# Patient Record
Sex: Female | Born: 1970 | Race: White | Hispanic: No | Marital: Single | Smoking: Current every day smoker
Health system: Southern US, Community
[De-identification: ages and names within clinical notes are randomized; demographics above are authoritative.]

## PROBLEM LIST (undated history)

## (undated) DIAGNOSIS — N920 Excessive and frequent menstruation with regular cycle: Secondary | ICD-10-CM

## (undated) HISTORY — DX: Excessive and frequent menstruation with regular cycle: N92.0

---

## 1998-04-14 ENCOUNTER — Emergency Department (HOSPITAL_COMMUNITY): Admission: EM | Admit: 1998-04-14 | Discharge: 1998-04-14 | Payer: Self-pay | Admitting: Emergency Medicine

## 2000-03-21 ENCOUNTER — Emergency Department (HOSPITAL_COMMUNITY): Admission: EM | Admit: 2000-03-21 | Discharge: 2000-03-21 | Payer: Self-pay | Admitting: Emergency Medicine

## 2001-09-20 ENCOUNTER — Ambulatory Visit (HOSPITAL_COMMUNITY): Admission: RE | Admit: 2001-09-20 | Discharge: 2001-09-20 | Payer: Self-pay | Admitting: *Deleted

## 2001-09-20 ENCOUNTER — Encounter: Payer: Self-pay | Admitting: *Deleted

## 2003-01-09 ENCOUNTER — Encounter: Payer: Self-pay | Admitting: Emergency Medicine

## 2003-01-09 ENCOUNTER — Emergency Department (HOSPITAL_COMMUNITY): Admission: EM | Admit: 2003-01-09 | Discharge: 2003-01-09 | Payer: Self-pay | Admitting: Emergency Medicine

## 2004-05-05 ENCOUNTER — Ambulatory Visit (HOSPITAL_COMMUNITY): Admission: RE | Admit: 2004-05-05 | Discharge: 2004-05-05 | Payer: Self-pay | Admitting: Family Medicine

## 2004-05-18 ENCOUNTER — Emergency Department (HOSPITAL_COMMUNITY): Admission: EM | Admit: 2004-05-18 | Discharge: 2004-05-18 | Payer: Self-pay | Admitting: Emergency Medicine

## 2005-01-14 ENCOUNTER — Emergency Department (HOSPITAL_COMMUNITY): Admission: EM | Admit: 2005-01-14 | Discharge: 2005-01-14 | Payer: Self-pay | Admitting: Emergency Medicine

## 2007-06-01 ENCOUNTER — Encounter: Admission: RE | Admit: 2007-06-01 | Discharge: 2007-06-01 | Payer: Self-pay | Admitting: Internal Medicine

## 2014-05-08 ENCOUNTER — Other Ambulatory Visit: Payer: Self-pay | Admitting: Family Medicine

## 2014-05-08 DIAGNOSIS — Z1231 Encounter for screening mammogram for malignant neoplasm of breast: Secondary | ICD-10-CM

## 2014-05-15 ENCOUNTER — Encounter: Payer: Self-pay | Admitting: *Deleted

## 2014-05-15 ENCOUNTER — Ambulatory Visit
Admission: RE | Admit: 2014-05-15 | Discharge: 2014-05-15 | Disposition: A | Discharge status: Home / Self Care | Payer: 59 | Source: Ambulatory Visit | Attending: Family Medicine | Admitting: Family Medicine

## 2014-05-15 DIAGNOSIS — Z1231 Encounter for screening mammogram for malignant neoplasm of breast: Secondary | ICD-10-CM

## 2014-05-21 ENCOUNTER — Encounter: Payer: Self-pay | Admitting: Obstetrics and Gynecology

## 2014-05-30 ENCOUNTER — Other Ambulatory Visit: Payer: Self-pay | Admitting: Family Medicine

## 2014-05-30 DIAGNOSIS — R928 Other abnormal and inconclusive findings on diagnostic imaging of breast: Secondary | ICD-10-CM

## 2014-06-09 ENCOUNTER — Ambulatory Visit
Admission: RE | Admit: 2014-06-09 | Discharge: 2014-06-09 | Disposition: A | Discharge status: Home / Self Care | Payer: 59 | Source: Ambulatory Visit | Attending: Family Medicine | Admitting: Family Medicine

## 2014-06-09 DIAGNOSIS — R928 Other abnormal and inconclusive findings on diagnostic imaging of breast: Secondary | ICD-10-CM

## 2015-01-23 ENCOUNTER — Encounter (HOSPITAL_BASED_OUTPATIENT_CLINIC_OR_DEPARTMENT_OTHER): Payer: Self-pay | Admitting: *Deleted

## 2015-01-23 ENCOUNTER — Emergency Department (HOSPITAL_BASED_OUTPATIENT_CLINIC_OR_DEPARTMENT_OTHER)
Admission: EM | Admit: 2015-01-23 | Discharge: 2015-01-23 | Disposition: A | Discharge status: Home / Self Care | Payer: No Typology Code available for payment source | Attending: Emergency Medicine | Admitting: Emergency Medicine

## 2015-01-23 ENCOUNTER — Emergency Department (HOSPITAL_BASED_OUTPATIENT_CLINIC_OR_DEPARTMENT_OTHER): Payer: No Typology Code available for payment source

## 2015-01-23 DIAGNOSIS — Z8742 Personal history of other diseases of the female genital tract: Secondary | ICD-10-CM | POA: Diagnosis not present

## 2015-01-23 DIAGNOSIS — Y998 Other external cause status: Secondary | ICD-10-CM | POA: Insufficient documentation

## 2015-01-23 DIAGNOSIS — Z79899 Other long term (current) drug therapy: Secondary | ICD-10-CM | POA: Diagnosis not present

## 2015-01-23 DIAGNOSIS — S300XXA Contusion of lower back and pelvis, initial encounter: Secondary | ICD-10-CM | POA: Insufficient documentation

## 2015-01-23 DIAGNOSIS — Y9389 Activity, other specified: Secondary | ICD-10-CM | POA: Diagnosis not present

## 2015-01-23 DIAGNOSIS — Z720 Tobacco use: Secondary | ICD-10-CM | POA: Diagnosis not present

## 2015-01-23 DIAGNOSIS — Y92410 Unspecified street and highway as the place of occurrence of the external cause: Secondary | ICD-10-CM | POA: Diagnosis not present

## 2015-01-23 DIAGNOSIS — S3992XA Unspecified injury of lower back, initial encounter: Secondary | ICD-10-CM | POA: Diagnosis present
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
## 2016-01-11 ENCOUNTER — Ambulatory Visit (INDEPENDENT_AMBULATORY_CARE_PROVIDER_SITE_OTHER): Payer: Self-pay | Admitting: Neurology

## 2016-01-11 ENCOUNTER — Encounter: Payer: Self-pay | Admitting: Neurology

## 2016-01-11 ENCOUNTER — Ambulatory Visit (INDEPENDENT_AMBULATORY_CARE_PROVIDER_SITE_OTHER): Payer: 59 | Admitting: Neurology

## 2016-01-11 DIAGNOSIS — R202 Paresthesia of skin: Secondary | ICD-10-CM | POA: Diagnosis not present

## 2016-01-11 NOTE — Procedures (Signed)
     HISTORY:  Alessandra Bevelsiffany Dubiel is a 45 year old patient with a several year history of progressively worsening paresthesias affecting the left hand and forearm primarily. She denies any neck pain or shoulder discomfort, and she has not had a significant weakness in the left arm. She denies any numbness on the face or the legs or the right arm. She is being evaluated for a possible neuropathy or a cervical radiculopathy.   NERVE CONDUCTION STUDIES:  Nerve conduction studies were performed on both upper extremities. The distal motor latencies and motor amplitudes for the median and ulnar nerves were within normal limits. The F wave latencies and nerve conduction velocities for these nerves were also normal. The sensory latencies for the median and ulnar nerves were normal.   EMG STUDIES:  EMG study was performed on the left upper extremity:  The first dorsal interosseous muscle reveals 2 to 4 K units with full recruitment. No fibrillations or positive waves were noted. The abductor pollicis brevis muscle reveals 2 to 4 K units with full recruitment. No fibrillations or positive waves were noted. The extensor indicis proprius muscle reveals 1 to 3 K units with full recruitment. No fibrillations or positive waves were noted. The pronator teres muscle reveals 2 to 3 K units with full recruitment. No fibrillations or positive waves were noted. The biceps muscle reveals 1 to 2 K units with full recruitment. No fibrillations or positive waves were noted. The triceps muscle reveals 2 to 4 K units with full recruitment. No fibrillations or positive waves were noted. The anterior deltoid muscle reveals 2 to 3 K units with full recruitment. No fibrillations or positive waves were noted. The cervical paraspinal muscles were tested at 2 levels. No abnormalities of insertional activity were seen at either level tested. There was good relaxation.   IMPRESSION:  Nerve conduction studies done on both  upper extremities were within normal limits. No evidence of a neuropathy is seen. EMG evaluation of the left upper extremity is unremarkable without evidence of an overlying cervical radiculopathy.  Marlan Palau. Keith Willis MD 01/11/2016 11:33 AM  [facility] [address] [place], [place] [id number]  Phone [phone] Fax [phone]

## 2016-01-11 NOTE — Progress Notes (Signed)
Please refer to EMG and nerve conduction study procedure note. 

## 2016-12-28 ENCOUNTER — Emergency Department (HOSPITAL_COMMUNITY): Payer: 59

## 2016-12-28 ENCOUNTER — Encounter (HOSPITAL_COMMUNITY): Payer: Self-pay | Admitting: Emergency Medicine

## 2016-12-28 ENCOUNTER — Emergency Department (HOSPITAL_COMMUNITY)
Admission: EM | Admit: 2016-12-28 | Discharge: 2016-12-28 | Discharge status: Against Medical Advice | Payer: 59 | Attending: Emergency Medicine | Admitting: Emergency Medicine

## 2016-12-28 DIAGNOSIS — K7689 Other specified diseases of liver: Secondary | ICD-10-CM | POA: Diagnosis not present

## 2016-12-28 DIAGNOSIS — R0689 Other abnormalities of breathing: Secondary | ICD-10-CM | POA: Insufficient documentation

## 2016-12-28 DIAGNOSIS — Y92410 Unspecified street and highway as the place of occurrence of the external cause: Secondary | ICD-10-CM | POA: Diagnosis not present

## 2016-12-28 DIAGNOSIS — Y9389 Activity, other specified: Secondary | ICD-10-CM | POA: Diagnosis not present

## 2016-12-28 DIAGNOSIS — M542 Cervicalgia: Secondary | ICD-10-CM | POA: Diagnosis not present

## 2016-12-28 DIAGNOSIS — S0990XA Unspecified injury of head, initial encounter: Secondary | ICD-10-CM | POA: Diagnosis not present

## 2016-12-28 DIAGNOSIS — Y999 Unspecified external cause status: Secondary | ICD-10-CM | POA: Insufficient documentation

## 2016-12-28 DIAGNOSIS — F17210 Nicotine dependence, cigarettes, uncomplicated: Secondary | ICD-10-CM | POA: Diagnosis not present

## 2016-12-28 DIAGNOSIS — M545 Low back pain: Secondary | ICD-10-CM | POA: Diagnosis not present

## 2016-12-28 LAB — I-STAT CHEM 8, ED
BUN: 5 mg/dL — ABNORMAL LOW (ref 6–20)
CHLORIDE: 108 mmol/L (ref 101–111)
CREATININE: 1.1 mg/dL — AB (ref 0.44–1.00)
Calcium, Ion: 1.11 mmol/L — ABNORMAL LOW (ref 1.15–1.40)
Glucose, Bld: 94 mg/dL (ref 65–99)
HEMATOCRIT: 51 % — AB (ref 36.0–46.0)
HEMOGLOBIN: 17.3 g/dL — AB (ref 12.0–15.0)
POTASSIUM: 4.1 mmol/L (ref 3.5–5.1)
Sodium: 145 mmol/L (ref 135–145)
TCO2: 26 mmol/L (ref 0–100)

## 2016-12-28 LAB — URINALYSIS, ROUTINE W REFLEX MICROSCOPIC
BACTERIA UA: NONE SEEN
BILIRUBIN URINE: NEGATIVE
Glucose, UA: NEGATIVE mg/dL
KETONES UR: NEGATIVE mg/dL
NITRITE: NEGATIVE
Protein, ur: NEGATIVE mg/dL
SPECIFIC GRAVITY, URINE: 1.003 — AB (ref 1.005–1.030)
pH: 5 (ref 5.0–8.0)

## 2016-12-28 LAB — RAPID URINE DRUG SCREEN, HOSP PERFORMED
Amphetamines: NOT DETECTED
BARBITURATES: NOT DETECTED
Benzodiazepines: NOT DETECTED
COCAINE: POSITIVE — AB
OPIATES: NOT DETECTED
TETRAHYDROCANNABINOL: POSITIVE — AB

## 2016-12-28 LAB — I-STAT BETA HCG BLOOD, ED (MC, WL, AP ONLY): I-stat hCG, quantitative: <5 m[IU]/mL (ref <5)

## 2016-12-28 MED ORDER — IOPAMIDOL (ISOVUE-300) INJECTION 61%
100.0000 mL | Freq: Once | INTRAVENOUS | Status: AC | PRN
  Administered 2016-12-28: 100 mL via INTRAVENOUS

## 2016-12-28 MED ORDER — SODIUM CHLORIDE 0.9 % IV BOLUS (SEPSIS): 1000.0000 mL | Freq: Once | INTRAVENOUS | Status: DC
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
## 2016-12-28 NOTE — ED Notes (Signed)
Applied cervical collar to neck for stabilization.

## 2016-12-28 NOTE — ED Notes (Signed)
Pt asked if she could remove c -collar.  Advised pt that she had to keep c-collar on.
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
## 2016-12-28 NOTE — ED Notes (Signed)
Pt up to bathroom ambulatory.  Pt removed c-collar.  Advised pt that she needed to keep c-collar on.
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
## 2016-12-28 NOTE — ED Notes (Signed)
Pt had 2 lab tubes drawn for ER labs and refused any other lab work.  Dr. Ranae PalmsYelverton notified.

## 2016-12-28 NOTE — ED Notes (Signed)
Pt states she is tired of waiting and is going to leave.  Advised pt to wait for physician to come give her results of her scans.  Pt refusing to talk to nurse.
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
## 2016-12-28 NOTE — ED Notes (Signed)
Pt up to bathroom ambulatory without difficulty.

## 2016-12-28 NOTE — ED Notes (Signed)
Pt ambulatory without difficulty.  Walked out of the ER.

## 2021-02-11 ENCOUNTER — Encounter (INDEPENDENT_AMBULATORY_CARE_PROVIDER_SITE_OTHER): Payer: Self-pay | Admitting: *Deleted

## 2021-02-15 ENCOUNTER — Other Ambulatory Visit: Payer: Self-pay | Admitting: Family Medicine

## 2021-02-15 DIAGNOSIS — Z1231 Encounter for screening mammogram for malignant neoplasm of breast: Secondary | ICD-10-CM

## 2021-06-21 ENCOUNTER — Encounter (INDEPENDENT_AMBULATORY_CARE_PROVIDER_SITE_OTHER): Payer: Self-pay | Admitting: Gastroenterology

## 2021-06-21 ENCOUNTER — Encounter (INDEPENDENT_AMBULATORY_CARE_PROVIDER_SITE_OTHER): Payer: Self-pay | Admitting: *Deleted

## 2021-06-21 ENCOUNTER — Ambulatory Visit (INDEPENDENT_AMBULATORY_CARE_PROVIDER_SITE_OTHER): Payer: 59 | Admitting: Gastroenterology
